# Patient Record
Sex: Male | Born: 1975 | Race: Black or African American | Hispanic: No | Marital: Married | Smoking: Current some day smoker
Health system: Southern US, Community
[De-identification: ages and names within clinical notes are randomized; demographics above are authoritative.]

## PROBLEM LIST (undated history)

## (undated) DIAGNOSIS — E785 Hyperlipidemia, unspecified: Secondary | ICD-10-CM

## (undated) DIAGNOSIS — T7840XA Allergy, unspecified, initial encounter: Secondary | ICD-10-CM

## (undated) DIAGNOSIS — G4730 Sleep apnea, unspecified: Secondary | ICD-10-CM

## (undated) DIAGNOSIS — E559 Vitamin D deficiency, unspecified: Secondary | ICD-10-CM

## (undated) HISTORY — DX: Allergy, unspecified, initial encounter: T78.40XA

## (undated) HISTORY — DX: Hyperlipidemia, unspecified: E78.5

## (undated) HISTORY — DX: Sleep apnea, unspecified: G47.30

## (undated) HISTORY — DX: Vitamin D deficiency, unspecified: E55.9

---

## 2008-07-25 ENCOUNTER — Emergency Department (HOSPITAL_COMMUNITY): Admission: EM | Admit: 2008-07-25 | Discharge: 2008-07-25 | Payer: Self-pay | Admitting: Family Medicine

## 2015-03-08 ENCOUNTER — Ambulatory Visit (INDEPENDENT_AMBULATORY_CARE_PROVIDER_SITE_OTHER): Payer: BLUE CROSS/BLUE SHIELD | Admitting: Physician Assistant

## 2015-03-08 VITALS — BP 124/70 | HR 72 | Temp 97.8°F | Resp 18 | Ht 72.0 in | Wt 219.2 lb

## 2015-03-08 DIAGNOSIS — Z13220 Encounter for screening for lipoid disorders: Secondary | ICD-10-CM

## 2015-03-08 DIAGNOSIS — Z130 Encounter for screening for diseases of the blood and blood-forming organs and certain disorders involving the immune mechanism: Secondary | ICD-10-CM

## 2015-03-08 DIAGNOSIS — R683 Clubbing of fingers: Secondary | ICD-10-CM

## 2015-03-08 DIAGNOSIS — L609 Nail disorder, unspecified: Secondary | ICD-10-CM | POA: Diagnosis not present

## 2015-03-08 DIAGNOSIS — Z131 Encounter for screening for diabetes mellitus: Secondary | ICD-10-CM | POA: Diagnosis not present

## 2015-03-08 DIAGNOSIS — Z1389 Encounter for screening for other disorder: Secondary | ICD-10-CM

## 2015-03-08 DIAGNOSIS — Z0000 Encounter for general adult medical examination without abnormal findings: Secondary | ICD-10-CM

## 2015-03-08 LAB — CBC
HEMATOCRIT: 46.2 % (ref 39.0–52.0)
Hemoglobin: 15.9 g/dL (ref 13.0–17.0)
MCH: 30.8 pg (ref 26.0–34.0)
MCHC: 34.4 g/dL (ref 30.0–36.0)
MCV: 89.5 fL (ref 78.0–100.0)
MPV: 10.3 fL (ref 8.6–12.4)
Platelets: 265 10*3/uL (ref 150–400)
RBC: 5.16 MIL/uL (ref 4.22–5.81)
RDW: 13.8 % (ref 11.5–15.5)
WBC: 11 10*3/uL — ABNORMAL HIGH (ref 4.0–10.5)

## 2015-03-08 LAB — LIPID PANEL
CHOL/HDL RATIO: 4 ratio
Cholesterol: 217 mg/dL — ABNORMAL HIGH (ref 0–200)
HDL: 54 mg/dL (ref >=40)
LDL Cholesterol: 134 mg/dL — ABNORMAL HIGH (ref 0–99)
Triglycerides: 143 mg/dL (ref <150)
VLDL: 29 mg/dL (ref 0–40)

## 2015-03-08 LAB — COMPLETE METABOLIC PANEL WITH GFR
ALBUMIN: 4.4 g/dL (ref 3.5–5.2)
ALK PHOS: 65 U/L (ref 39–117)
ALT: 29 U/L (ref 0–53)
AST: 29 U/L (ref 0–37)
BILIRUBIN TOTAL: 0.5 mg/dL (ref 0.2–1.2)
BUN: 12 mg/dL (ref 6–23)
CO2: 26 meq/L (ref 19–32)
Calcium: 9.1 mg/dL (ref 8.4–10.5)
Chloride: 104 mEq/L (ref 96–112)
Creat: 1.05 mg/dL (ref 0.50–1.35)
GFR, Est African American: >89 mL/min
GFR, Est Non African American: >89 mL/min
GLUCOSE: 87 mg/dL (ref 70–99)
POTASSIUM: 4.3 meq/L (ref 3.5–5.3)
SODIUM: 140 meq/L (ref 135–145)
Total Protein: 7.8 g/dL (ref 6.0–8.3)

## 2015-03-08 NOTE — Patient Instructions (Signed)
We have drawn labs today and I'll be in touch with you with those results.   Health Maintenance A healthy lifestyle and preventative care can promote health and wellness.  Maintain regular health, dental, and eye exams.  Eat a healthy diet. Foods like vegetables, fruits, whole grains, low-fat dairy products, and lean protein foods contain the nutrients you need and are low in calories. Decrease your intake of foods high in solid fats, added sugars, and salt. Get information about a proper diet from your health care provider, if necessary.  Regular physical exercise is one of the most important things you can do for your health. Most adults should get at least 150 minutes of moderate-intensity exercise (any activity that increases your heart rate and causes you to sweat) each week. In addition, most adults need muscle-strengthening exercises on 2 or more days a week.   Maintain a healthy weight. The body mass index (BMI) is a screening tool to identify possible weight problems. It provides an estimate of body fat based on height and weight. Your health care provider can find your BMI and can help you achieve or maintain a healthy weight. For males 20 years and older:  A BMI below 18.5 is considered underweight.  A BMI of 18.5 to 24.9 is normal.  A BMI of 25 to 29.9 is considered overweight.  A BMI of 30 and above is considered obese.  Maintain normal blood lipids and cholesterol by exercising and minimizing your intake of saturated fat. Eat a balanced diet with plenty of fruits and vegetables. Blood tests for lipids and cholesterol should begin at age 59 and be repeated every 5 years. If your lipid or cholesterol levels are high, you are over age 52, or you are at high risk for heart disease, you may need your cholesterol levels checked more frequently.Ongoing high lipid and cholesterol levels should be treated with medicines if diet and exercise are not working.  If you smoke, find out from  your health care provider how to quit. If you do not use tobacco, do not start.  Lung cancer screening is recommended for adults aged 55-80 years who are at high risk for developing lung cancer because of a history of smoking. A yearly low-dose CT scan of the lungs is recommended for people who have at least a 30-pack-year history of smoking and are current smokers or have quit within the past 15 years. A pack year of smoking is smoking an average of 1 pack of cigarettes a day for 1 year (for example, a 30-pack-year history of smoking could mean smoking 1 pack a day for 30 years or 2 packs a day for 15 years). Yearly screening should continue until the smoker has stopped smoking for at least 15 years. Yearly screening should be stopped for people who develop a health problem that would prevent them from having lung cancer treatment.  If you choose to drink alcohol, do not have more than 2 drinks per day. One drink is considered to be 12 oz (360 mL) of beer, 5 oz (150 mL) of wine, or 1.5 oz (45 mL) of liquor.  Avoid the use of street drugs. Do not share needles with anyone. Ask for help if you need support or instructions about stopping the use of drugs.  High blood pressure causes heart disease and increases the risk of stroke. Blood pressure should be checked at least every 1-2 years. Ongoing high blood pressure should be treated with medicines if weight loss and  exercise are not effective.  If you are 5245-38 years old, ask your health care provider if you should take aspirin to prevent heart disease.  Diabetes screening involves taking a blood sample to check your fasting blood sugar level. This should be done once every 3 years after age 38 if you are at a normal weight and without risk factors for diabetes. Testing should be considered at a younger age or be carried out more frequently if you are overweight and have at least 1 risk factor for diabetes.  Colorectal cancer can be detected and often  prevented. Most routine colorectal cancer screening begins at the age of 38 and continues through age 38. However, your health care provider may recommend screening at an earlier age if you have risk factors for colon cancer. On a yearly basis, your health care provider may provide home test kits to check for hidden blood in the stool. A small camera at the end of a tube may be used to directly examine the colon (sigmoidoscopy or colonoscopy) to detect the earliest forms of colorectal cancer. Talk to your health care provider about this at age 38 when routine screening begins. A direct exam of the colon should be repeated every 5-10 years through age 38, unless early forms of precancerous polyps or small growths are found.  People who are at an increased risk for hepatitis B should be screened for this virus. You are considered at high risk for hepatitis B if:  You were born in a country where hepatitis B occurs often. Talk with your health care provider about which countries are considered high risk.  Your parents were born in a high-risk country and you have not received a shot to protect against hepatitis B (hepatitis B vaccine).  You have HIV or AIDS.  You use needles to inject street drugs.  You live with, or have sex with, someone who has hepatitis B.  You are a man who has sex with other men (MSM).  You get hemodialysis treatment.  You take certain medicines for conditions like cancer, organ transplantation, and autoimmune conditions.  Hepatitis C blood testing is recommended for all people born from [id number] through 1965 and any individual with known risk factors for hepatitis C.  Healthy men should no longer receive prostate-specific antigen (PSA) blood tests as part of routine cancer screening. Talk to your health care provider about prostate cancer screening.  Testicular cancer screening is not recommended for adolescents or adult males who have no symptoms. Screening includes  self-exam, a health care provider exam, and other screening tests. Consult with your health care provider about any symptoms you have or any concerns you have about testicular cancer.  Practice safe sex. Use condoms and avoid high-risk sexual practices to reduce the spread of sexually transmitted infections (STIs).  You should be screened for STIs, including gonorrhea and chlamydia if:  You are sexually active and are younger than 24 years.  You are older than 24 years, and your health care provider tells you that you are at risk for this type of infection.  Your sexual activity has changed since you were last screened, and you are at an increased risk for chlamydia or gonorrhea. Ask your health care provider if you are at risk.  If you are at risk of being infected with HIV, it is recommended that you take a prescription medicine daily to prevent HIV infection. This is called pre-exposure prophylaxis (PrEP). You are considered at risk if:  You  are a man who has sex with other men (MSM).  You are a heterosexual man who is sexually active with multiple partners.  You take drugs by injection.  You are sexually active with a partner who has HIV.  Talk with your health care provider about whether you are at high risk of being infected with HIV. If you choose to begin PrEP, you should first be tested for HIV. You should then be tested every 3 months for as long as you are taking PrEP.  Use sunscreen. Apply sunscreen liberally and repeatedly throughout the day. You should seek shade when your shadow is shorter than you. Protect yourself by wearing long sleeves, pants, a wide-brimmed hat, and sunglasses year round whenever you are outdoors.  Tell your health care provider of new moles or changes in moles, especially if there is a change in shape or color. Also, tell your health care provider if a mole is larger than the size of a pencil eraser.  A one-time screening for abdominal aortic aneurysm  (AAA) and surgical repair of large AAAs by ultrasound is recommended for men aged 14-75 years who are current or former smokers.  Stay current with your vaccines (immunizations). Document Released: 03/20/2008 Document Revised: 09/27/2013 Document Reviewed: 02/17/2011 [facility] Patient Information 2015 [place], [place]. This information is not intended to replace advice given to you by your health care provider. Make sure you discuss any questions you have with your health care provider.

## 2015-03-08 NOTE — Progress Notes (Signed)
Subjective:    Patient ID: Paul Mayer, male    DOB: March 07, 1976, 39 y.o.   MRN: [id number]  Chief Complaint  Patient presents with  . Annual Exam   There are no active problems to display for this patient.  Prior to Admission medications   Not on File   Medications, allergies, past medical history, surgical history, family history, social history and problem list reviewed and updated.   HPI  [age] yom presents for cpe.  He is healthy and has no concerns or issues. States his wife wanted him to come get a cpe. He denies any issues or complaints.   Vision: None, does not wear glasses/contacts Dentist: Once yearly Exercise: 1-2 x week, free weights and walking. Diet: Nothing specific. Avoids red meat as feels like he gets upset stomach with it.  He works full time as an Art gallery managerengineer.   He received tdap vaccine 4 yrs ago through work.   Review of Systems Per CPE ROS sheet. 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  Seasonal allergies: Takes claritin prn.     Objective:   Physical Exam  Constitutional: He is oriented to person, place, and time. He appears well-developed and well-nourished.  Non-toxic appearance. He does not have a sickly appearance. He does not appear ill. No distress.  BP 124/70 mmHg  Pulse 72  Temp(Src) 97.8 F (36.6 C) (Oral)  Resp 18  Ht 6' (1.829 m)  Wt 219 lb 3.2 oz (99.428 kg)  BMI 29.72 kg/m2  SpO2 98%   HENT:  Right Ear: Tympanic membrane normal.  Left Ear: Tympanic membrane normal.  Nose: Nose normal. Right sinus exhibits no maxillary sinus tenderness and no frontal sinus tenderness. Left sinus exhibits no maxillary sinus tenderness and no frontal sinus tenderness.  Mouth/Throat: Uvula is midline, oropharynx is clear and moist and mucous membranes are normal.  Eyes: Conjunctivae and EOM are normal. Pupils are equal, round, and reactive to light.  Neck: Normal range of motion. Carotid bruit is not present. No  thyroid mass and no thyromegaly present.  Cardiovascular: Normal rate, regular rhythm and normal heart sounds.   Pulses:      Dorsalis pedis pulses are 2+ on the right side, and 2+ on the left side.       Posterior tibial pulses are 2+ on the right side, and 2+ on the left side.  Pulmonary/Chest: Effort normal and breath sounds normal.  Abdominal: Soft. Normal appearance and bowel sounds are normal. There is no hepatosplenomegaly. There is no tenderness. There is no CVA tenderness.  Lymphadenopathy:       Head (right side): No submental, no submandibular and no tonsillar adenopathy present.       Head (left side): No submental, no submandibular and no tonsillar adenopathy present.    He has no cervical adenopathy.  Neurological: He is alert and oriented to person, place, and time. He has normal strength. No cranial nerve deficit or sensory deficit.  Skin: Nails show clubbing.  Psychiatric: He has a normal mood and affect. His speech is normal and behavior is normal.      Assessment & Plan:   39 yom presents for cpe.  Annual physical exam Screening for deficiency anemia - Plan: CBC Screening for nephropathy - Plan: COMPLETE METABOLIC PANEL WITH GFR Screening for hyperlipidemia - Plan: Lipid panel Screening for diabetes mellitus - Plan: COMPLETE METABOLIC PANEL WITH GFR --normal exam, vitals other than clubbing below --screening tests as above,  pt declines std testing --not due for immunizations or preventative testing at this time  Clubbing of nail  --normal cardiac, pulm exam today --pt denies cardiac or pulm sx --no further w/u at this time  Donnajean Lopes, PA-C Physician Assistant-Certified [facility]  03/08/2015 2:44 PM

## 2016-11-09 ENCOUNTER — Ambulatory Visit (HOSPITAL_COMMUNITY)
Admission: EM | Admit: 2016-11-09 | Discharge: 2016-11-09 | Disposition: A | Discharge status: Home / Self Care | Payer: Self-pay | Attending: Family Medicine | Admitting: Family Medicine

## 2016-11-09 ENCOUNTER — Encounter (HOSPITAL_COMMUNITY): Payer: Self-pay | Admitting: Emergency Medicine

## 2016-11-09 ENCOUNTER — Ambulatory Visit (INDEPENDENT_AMBULATORY_CARE_PROVIDER_SITE_OTHER): Payer: Self-pay

## 2016-11-09 DIAGNOSIS — S96911A Strain of unspecified muscle and tendon at ankle and foot level, right foot, initial encounter: Secondary | ICD-10-CM

## 2016-11-09 DIAGNOSIS — S66211A Strain of extensor muscle, fascia and tendon of right thumb at wrist and hand level, initial encounter: Secondary | ICD-10-CM

## 2016-11-09 DIAGNOSIS — M542 Cervicalgia: Secondary | ICD-10-CM

## 2016-11-09 DIAGNOSIS — S39012A Strain of muscle, fascia and tendon of lower back, initial encounter: Secondary | ICD-10-CM

## 2016-11-09 MED ORDER — DICLOFENAC SODIUM 75 MG PO TBEC: 75.0000 mg | DELAYED_RELEASE_TABLET | Freq: Two times a day (BID) | ORAL | 0 refills | Status: DC

## 2016-11-09 NOTE — Discharge Instructions (Signed)
You have an feel sore in multiple areas over the next several days. You may take ibuprofen to help reduce some of these sore ligaments and muscles.  We don't see any bony abnormality in the neck, so I expect this to be resolving over the next 7 days as well. Please feel free to come back in a week if her symptoms have not improved.
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
## 2016-11-12 ENCOUNTER — Emergency Department (HOSPITAL_COMMUNITY): Payer: Managed Care, Other (non HMO)

## 2016-11-12 ENCOUNTER — Emergency Department (HOSPITAL_COMMUNITY)
Admission: EM | Admit: 2016-11-12 | Discharge: 2016-11-12 | Disposition: A | Discharge status: Home / Self Care | Payer: Managed Care, Other (non HMO) | Attending: Emergency Medicine | Admitting: Emergency Medicine

## 2016-11-12 ENCOUNTER — Encounter (HOSPITAL_COMMUNITY): Payer: Self-pay | Admitting: Emergency Medicine

## 2016-11-12 DIAGNOSIS — Y999 Unspecified external cause status: Secondary | ICD-10-CM | POA: Diagnosis not present

## 2016-11-12 DIAGNOSIS — F17290 Nicotine dependence, other tobacco product, uncomplicated: Secondary | ICD-10-CM | POA: Insufficient documentation

## 2016-11-12 DIAGNOSIS — H538 Other visual disturbances: Secondary | ICD-10-CM | POA: Diagnosis not present

## 2016-11-12 DIAGNOSIS — Y92410 Unspecified street and highway as the place of occurrence of the external cause: Secondary | ICD-10-CM | POA: Insufficient documentation

## 2016-11-12 DIAGNOSIS — Z79899 Other long term (current) drug therapy: Secondary | ICD-10-CM | POA: Insufficient documentation

## 2016-11-12 DIAGNOSIS — R110 Nausea: Secondary | ICD-10-CM

## 2016-11-12 DIAGNOSIS — Y939 Activity, unspecified: Secondary | ICD-10-CM | POA: Diagnosis not present

## 2016-11-12 DIAGNOSIS — G44321 Chronic post-traumatic headache, intractable: Secondary | ICD-10-CM | POA: Insufficient documentation

## 2016-11-12 DIAGNOSIS — R51 Headache: Secondary | ICD-10-CM | POA: Diagnosis present

## 2016-11-12 LAB — I-STAT CREATININE, ED: Creatinine, Ser: 1.1 mg/dL (ref 0.61–1.24)

## 2016-11-12 MED ORDER — ACETAMINOPHEN 500 MG PO TABS
1000.0000 mg | ORAL_TABLET | Freq: Once | ORAL | Status: AC
  Administered 2016-11-12: 1000 mg via ORAL
  Filled 2016-11-12: qty 2

## 2016-11-12 MED ORDER — SODIUM CHLORIDE 0.9 % IJ SOLN
INTRAMUSCULAR | Status: AC
  Filled 2016-11-12: qty 50

## 2016-11-12 MED ORDER — IOPAMIDOL (ISOVUE-370) INJECTION 76%
INTRAVENOUS | Status: AC
  Administered 2016-11-12: 100 mL
  Filled 2016-11-12: qty 100

## 2016-11-12 MED ORDER — ONDANSETRON HCL 4 MG PO TABS: 4.0000 mg | ORAL_TABLET | Freq: Four times a day (QID) | ORAL | 0 refills | Status: DC

## 2016-11-12 MED ORDER — METHOCARBAMOL 500 MG PO TABS: 500.0000 mg | ORAL_TABLET | Freq: Two times a day (BID) | ORAL | 0 refills | Status: DC
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
## 2016-11-12 NOTE — ED Notes (Signed)
Patient was alert, oriented and stable upon discharge. RN went over AVS and patient had no further questions.  Pt was advised not to drive on muscle relaxer.

## 2017-05-27 ENCOUNTER — Ambulatory Visit (INDEPENDENT_AMBULATORY_CARE_PROVIDER_SITE_OTHER): Payer: Managed Care, Other (non HMO) | Admitting: Emergency Medicine

## 2017-05-27 ENCOUNTER — Encounter: Payer: Self-pay | Admitting: Emergency Medicine

## 2017-05-27 VITALS — BP 120/76 | HR 70 | Temp 98.7°F | Resp 16 | Ht 71.75 in | Wt 236.0 lb

## 2017-05-27 DIAGNOSIS — R5383 Other fatigue: Secondary | ICD-10-CM | POA: Diagnosis not present

## 2017-05-27 DIAGNOSIS — R0683 Snoring: Secondary | ICD-10-CM | POA: Diagnosis not present

## 2017-05-27 DIAGNOSIS — R400 Somnolence: Secondary | ICD-10-CM

## 2017-05-27 NOTE — Progress Notes (Signed)
Paul Mayer 41 y.o.   Chief Complaint  Patient presents with  . Referral    snoring and tired in am    HISTORY OF PRESENT ILLNESS: This is a 41 y.o. male complaining of feeling tired in am with daytime sleepiness; wife states he has loud snoring with periods of interrupted breathing.  HPI   Prior to Admission medications   Medication Sig Start Date End Date Taking? Authorizing Provider  diclofenac (VOLTAREN) 75 MG EC tablet Take 1 tablet (75 mg total) by mouth 2 (two) times daily. 11/09/16   Elvina Sidle, MD  methocarbamol (ROBAXIN) 500 MG tablet Take 1 tablet (500 mg total) by mouth 2 (two) times daily. 11/12/16   Liberty Handy, PA-C  ondansetron (ZOFRAN) 4 MG tablet Take 1 tablet (4 mg total) by mouth every 6 (six) hours. 11/12/16   Liberty Handy, PA-C    No Known Allergies  There are no active problems to display for this patient.   Past Medical History:  Diagnosis Date  . Allergy     No past surgical history on file.  Social History   Social History  . Marital status: Married    Spouse name: N/A  . Number of children: N/A  . Years of education: N/A   Occupational History  . Not on file.   Social History Main Topics  . Smoking status: Current Some Day Smoker    Packs/day: 0.00    Types: Cigars  . Smokeless tobacco: Never Used  . Alcohol use Yes  . Drug use: No  . Sexual activity: Not on file   Other Topics Concern  . Not on file   Social History Narrative   ** Merged History Encounter **        Family History  Problem Relation Age of Onset  . Hyperlipidemia Father      Review of Systems  Constitutional: Negative.  Negative for chills and fever.  HENT: Negative.   Eyes: Negative.   Respiratory: Negative for cough, shortness of breath and wheezing.   Cardiovascular: Negative for chest pain, palpitations and leg swelling.  Gastrointestinal: Negative for abdominal pain, diarrhea, nausea and vomiting.  Musculoskeletal: Negative  for myalgias and neck pain.  Skin: Negative for rash.  Neurological: Negative for dizziness, tingling, sensory change, focal weakness and headaches.  Endo/Heme/Allergies: Negative.   All other systems reviewed and are negative.  Vitals:   05/27/17 1737  BP: 120/76  Pulse: 70  Resp: 16  Temp: 98.7 F (37.1 C)  SpO2: 99%     Physical Exam  Constitutional: He is oriented to person, place, and time. He appears well-developed and well-nourished.  HENT:  Head: Normocephalic and atraumatic.  Mouth/Throat: Oropharynx is clear and moist.  Eyes: Pupils are equal, round, and reactive to light. Conjunctivae and EOM are normal.  Neck: Normal range of motion. Neck supple.  Cardiovascular: Normal rate, regular rhythm and normal heart sounds.   Pulmonary/Chest: Effort normal and breath sounds normal.  Musculoskeletal: Normal range of motion.  Neurological: He is alert and oriented to person, place, and time. No sensory deficit. He exhibits normal muscle tone.  Skin: Skin is warm and dry. Capillary refill takes less than 2 seconds. No rash noted.  Psychiatric: He has a normal mood and affect. His behavior is normal.  Vitals reviewed.    ASSESSMENT & PLAN: [name] was seen today for referral.  Diagnoses and all orders for this visit:  Snoring Comments: suspected sleep apnea Orders: -  Ambulatory referral to Neurology  Daytime sleepiness -     Ambulatory referral to Neurology  Tiredness -     Ambulatory referral to Neurology    Patient Instructions       IF you received an x-ray today, you will receive an invoice from [facility]. Please contact [facility] at [phone] with questions or concerns regarding your invoice.   IF you received labwork today, you will receive an invoice from [facility]. Please contact [facility] at [phone] with questions or concerns regarding your invoice.   Our billing staff will not be able to assist you with questions  regarding bills from these companies.  You will be contacted with the lab results as soon as they are available. The fastest way to get your results is to activate your My Chart account. Instructions are located on the last page of this paperwork. If you have not heard from [place] regarding the results in 2 weeks, please contact this office.     Sleep Apnea Sleep apnea is a condition that affects breathing. People with sleep apnea have moments during sleep when their breathing pauses briefly or gets shallow. Sleep apnea can cause these symptoms:  Trouble staying asleep.  Sleepiness or tiredness during the day.  Irritability.  Loud snoring.  Morning headaches.  Trouble concentrating.  Forgetting things.  Less interest in sex.  Being sleepy for no reason.  Mood swings.  Personality changes.  Depression.  Waking up a lot during the night to pee (urinate).  Dry mouth.  Sore throat.  Follow these instructions at home:  Make any changes in your routine that your doctor recommends.  Eat a healthy, well-balanced diet.  Take over-the-counter and prescription medicines only as told by your doctor.  Avoid using alcohol, calming medicines (sedatives), and narcotic medicines.  Take steps to lose weight if you are overweight.  If you were given a machine (device) to use while you sleep, use it only as told by your doctor.  Do not use any tobacco products, such as cigarettes, chewing tobacco, and e-cigarettes. If you need help quitting, ask your doctor.  Keep all follow-up visits as told by your doctor. This is important. Contact a doctor if:  The machine that you were given to use during sleep is uncomfortable or does not seem to be working.  Your symptoms do not get better.  Your symptoms get worse. Get help right away if:  Your chest hurts.  You have trouble breathing in enough air (shortness of breath).  You have an uncomfortable feeling in your back, arms, or  stomach.  You have trouble talking.  One side of your body feels weak.  A part of your face is hanging down (drooping). These symptoms may be an emergency. Do not wait to see if the symptoms will go away. Get medical help right away. Call your local emergency services (911 in the [place] Do not drive yourself to the hospital. This information is not intended to replace advice given to you by your health care provider. Make sure you discuss any questions you have with your health care provider. Document Released: 07/01/2008 Document Revised: 05/18/2016 Document Reviewed: 07/02/2015 Elsevier Interactive Patient Education  2018 Elsevier Inc.      Edwina Barth, MD [facility]

## 2017-05-27 NOTE — Patient Instructions (Addendum)
     IF you received an x-ray today, you will receive an invoice from [facility]. Please contact [facility] at [phone] with questions or concerns regarding your invoice.   IF you received labwork today, you will receive an invoice from [facility]. Please contact [facility] at [phone] with questions or concerns regarding your invoice.   Our billing staff will not be able to assist you with questions regarding bills from these companies.  You will be contacted with the lab results as soon as they are available. The fastest way to get your results is to activate your My Chart account. Instructions are located on the last page of this paperwork. If you have not heard from us regarding the results in 2 weeks, please contact this office.     Sleep Apnea Sleep apnea is a condition that affects breathing. People with sleep apnea have moments during sleep when their breathing pauses briefly or gets shallow. Sleep apnea can cause these symptoms:  Trouble staying asleep.  Sleepiness or tiredness during the day.  Irritability.  Loud snoring.  Morning headaches.  Trouble concentrating.  Forgetting things.  Less interest in sex.  Being sleepy for no reason.  Mood swings.  Personality changes.  Depression.  Waking up a lot during the night to pee (urinate).  Dry mouth.  Sore throat.  Follow these instructions at home:  Make any changes in your routine that your doctor recommends.  Eat a healthy, well-balanced diet.  Take over-the-counter and prescription medicines only as told by your doctor.  Avoid using alcohol, calming medicines (sedatives), and narcotic medicines.  Take steps to lose weight if you are overweight.  If you were given a machine (device) to use while you sleep, use it only as told by your doctor.  Do not use any tobacco products, such as cigarettes, chewing tobacco, and e-cigarettes. If you need help quitting, ask your  doctor.  Keep all follow-up visits as told by your doctor. This is important. Contact a doctor if:  The machine that you were given to use during sleep is uncomfortable or does not seem to be working.  Your symptoms do not get better.  Your symptoms get worse. Get help right away if:  Your chest hurts.  You have trouble breathing in enough air (shortness of breath).  You have an uncomfortable feeling in your back, arms, or stomach.  You have trouble talking.  One side of your body feels weak.  A part of your face is hanging down (drooping). These symptoms may be an emergency. Do not wait to see if the symptoms will go away. Get medical help right away. Call your local emergency services (911 in the [place] Do not drive yourself to the hospital. This information is not intended to replace advice given to you by your health care provider. Make sure you discuss any questions you have with your health care provider. Document Released: 07/01/2008 Document Revised: 05/18/2016 Document Reviewed: 07/02/2015 Elsevier Interactive Patient Education  2018 [employer] Inc.  

## 2017-06-11 ENCOUNTER — Ambulatory Visit (INDEPENDENT_AMBULATORY_CARE_PROVIDER_SITE_OTHER): Payer: Managed Care, Other (non HMO) | Admitting: Neurology

## 2017-06-11 ENCOUNTER — Encounter: Payer: Self-pay | Admitting: Neurology

## 2017-06-11 VITALS — BP 120/81 | HR 62 | Ht 73.0 in | Wt 234.0 lb

## 2017-06-11 DIAGNOSIS — G4730 Sleep apnea, unspecified: Secondary | ICD-10-CM

## 2017-06-11 DIAGNOSIS — G4710 Hypersomnia, unspecified: Secondary | ICD-10-CM

## 2017-06-11 DIAGNOSIS — R0683 Snoring: Secondary | ICD-10-CM

## 2017-06-11 NOTE — Progress Notes (Signed)
SLEEP MEDICINE CLINIC   Provider:  Melvyn Novas, M D  Primary Care Physician:   Referring Provider: Georgina Quint, *   Chief Complaint  Patient presents with  . New Patient (Initial Visit)    pt ia alone rrom 11, complains of feeling tired in the am and having daytime sleepiness. pt wife has told him he snores and has some periods of uninterupted breathing during sleep. avg about 6 hours of uninterupted sleep.     HPI:  Paul Mayer is a 41 y.o. male , seen here as in a referral/ revisit  from Dr. Alvy Bimler for hypersomnia.   Chief complaint according to patient :" I can go to sleep easily, and feel not refreshed in AM "    I am seeing Paul Mayer today for a sleep consultation on 06/11/2017. He is a 41 year old right-handed African-American gentleman referred by Dr. Alvy Bimler. The patient had reported daytime fatigue, feeling tired and his wife had stated that she snores loudly-  she also witnessed periods of interrupted breathing, likely apnea.   Paul Mayer had no significant past medical history, does not take any prescription medications at this time, and both parents are alive and well.  Sleep habits are as follows: Paul Mayer would like a bedtime between 9 and 10 PM, but it has been difficult to adhere to this pattern due to his 61-year-old sons variable bedtimes. Many mornings he is still awake at one or 2 AM. Once asleep he stays asleep, he does not have frequent bathroom breaks, he has not woken himself short of breath or choking- but snoring. The patient sleeps on average 6 hours at night, he sleeps on his side, on one pillow. The [facility] bedroom is cool, quiet and dark and conducive to sleep. He rises with difficulties in the morning, often hitting the snooze button. His goal would be to rise at 6:30 but it's usually around 7. Work starts at 7:30 AM, he basically tele-commutes from home.Paul Mayer occasionally has vivid, lucid dreams- but not on a nightly basis. He does  suffer from some seasonal allergies, and during allergy season may wake this morning headaches, but not on a regular basis. However he feels not ready to rise in the morning he's not energized or refreshed. He states that after 5 PM if he can retreat to a reclining position, he will sleep. These may take 2 hours. He has no trouble going back to sleep at night, he does feel refreshed by naps.   Sleep medical history and family sleep history:  Father is snoring. Obese. Mothers snores. Nobody with a diagnosed OSA. no history of sleep walking.  Social history: married, toddle son, sales in skincare/ wound care. smoking cigars - 8 a month( week end smoker).ETOH : whisky, red wine - 2 glasses at night.  Caffeine : 1 mug in AM , no sodas, rare iced teas.    Review of Systems: Out of a complete 14 system review, the patient complains of only the following symptoms, and all other reviewed systems are negative.  Snoring. weight gain. Alcohol use.  Epworth score 8 , Fatigue severity score 28  , depression score n/a    Social History   Social History  . Marital status: Married    Spouse name: N/A  . Number of children: N/A  . Years of education: N/A   Occupational History  . Not on file.   Social History Main Topics  . Smoking status: Current Some Day Smoker  Packs/day: 0.00    Types: Cigars  . Smokeless tobacco: Never Used  . Alcohol use Yes  . Drug use: No  . Sexual activity: Not on file   Other Topics Concern  . Not on file   Social History Narrative   ** Merged History Encounter **        Family History  Problem Relation Age of Onset  . Hyperlipidemia Father     Past Medical History:  Diagnosis Date  . Allergy     No past surgical history on file.  No current outpatient prescriptions on file.   No current facility-administered medications for this visit.     Allergies as of 06/11/2017  . (No Known Allergies)    Vitals: BP 120/81   Pulse 62   Ht   (1.854 m)   Wt 234 lb (106.1 kg)   BMI 30.87 kg/m  Last Weight:  Wt Readings from Last 1 Encounters:  06/11/17 234 lb (106.1 kg)   ZOX:WRUE mass index is 30.87 kg/m.     Last Height:   Ht Readings from Last 1 Encounters:  06/11/17  (1.854 m)    Physical exam:  General: The patient is awake, alert and appears not in acute distress. The patient is well groomed. Head: Normocephalic, atraumatic. Neck is supple. Mallampati 4,  neck circumference: 18. Nasal airflow patent. Retrognathia is seen.  Cardiovascular:  Regular rate and rhythm , without  murmurs or carotid bruit, and without distended neck veins. Respiratory: Lungs are clear to auscultation. Skin:  Without evidence of edema, or rash Trunk: BMI is elevated . The patient's posture is erect   Neurologic exam : The patient is awake and alert, oriented to place and time.   Memory subjective described as intact.  Attention span & concentration ability appears normal.  Speech is fluent,  without dysarthria, dysphonia or aphasia.  Mood and affect are appropriate.  Cranial nerves: Pupils are equal and briskly reactive to light. Funduscopic exam without  evidence of pallor or edema. Extraocular movements  in vertical and horizontal planes intact and without nystagmus. Visual fields by finger perimetry are intact. Hearing to finger rub intact. Facial sensation intact to fine touch. Facial motor strength is symmetric and tongue and uvula move midline. Shoulder shrug was symmetrical.   Motor exam:  Normal tone, muscle bulk and symmetric strength in all extremities. Sensory:  Fine touch, pinprick and vibration were tested in all extremities. Proprioception tested in the upper extremities was normal. Coordination: Rapid alternating movements/ Finger-to-nose maneuver  normal without evidence of ataxia, dysmetria or tremor. Gait and station: Patient walks without assistive device. Turns with 3 Steps. Romberg testing is negative. Deep  tendon reflexes: in the  upper and lower extremities are symmetric and intact. Babinski maneuver response is downgoing.   Assessment:  After physical and neurologic examination, review of laboratory studies,  Personal review of imaging studies, reports of other /same  Imaging studies, results of polysomnography and / or neurophysiology testing and pre-existing records as far as provided in visit., my assessment is   1) Mr. Brickey may have apnea , he is snoring and excessively daytime sleepy. He is somewhat sleep deprived. His sleep rhythm has suffered under his son's sleep times- no established bedtime and no routines and rituals.  The patient was advised of the nature of the diagnosed disorder , the treatment options and the  risks for general health and wellness arising from not treating the condition.   I spent more  than 45 minutes of face to face time with the patient.  Greater than 50% of time was spent in counseling and coordination of care. We have discussed the diagnosis and differential and I answered the patient's questions.    Plan:  Treatment plan and additional workup : establish a bedtime for you and your child.  We will perform a HST to see if apnea is present. If you are only snoring- we can use a dental device.       Melvyn NovasARMEN Yitzchak Kothari, MD 06/11/2017, 1:54 PM  Certified in Neurology by ABPN Certified in Sleep Medicine by [facility]  [facility] [address], [address] [place], [place] [id number]

## 2017-06-11 NOTE — Patient Instructions (Signed)
Please remember to try to maintain good sleep hygiene, which means: Keep a regular sleep and wake schedule, try not to exercise or have a meal within 2 hours of your bedtime, try to keep your bedroom conducive for sleep, that is, cool and dark, without light distractors such as an illuminated alarm clock, and refrain from watching TV right before sleep or in the middle of the night and do not keep the TV or radio on during the night. Also, try not to use or play on electronic devices at bedtime, such as your cell phone, tablet PC or laptop. If you like to read at bedtime on an electronic device, try to dim the background light as much as possible. Do not eat in the middle of the night.   Sleep Studies A sleep study (polysomnogram) is a series of tests done while you are sleeping. It can show how well you sleep. This can help your health care provider diagnose a sleep disorder and show how severe your sleep disorder is. A sleep study may lead to treatment that will help you sleep better and prevent other medical problems caused by poor sleep. If you have a sleep disorder, you may also be at risk for:  Sleep-related accidents.  High blood pressure.  Heart disease.  Stroke.  Other medical conditions.  Sleep disorders are common. Your health care provider may suspect a sleep disorder if you:  Have loud snoring most nights.  Have brief periods when you stop breathing at night.  Feel sleepy on most days.  Fall asleep suddenly during the day.  Have trouble falling asleep or staying asleep.  Feel like you need to move your legs when trying to fall asleep.  Have dreams that seem very real shortly after falling asleep.  Feel like you cannot move when you first wake up.  Which tests will I need to have? Most sleep studies last all night and include these tests:  Recordings of your brain activity.  Recordings of your eye movements.  Recording of your heart rate and rhythm.  Blood  pressure readings.  Readings of the amount of oxygen in your blood.  Measurements of your chest and belly movement as you breathe during sleep.  If you have signs of the sleep disorder called sleep apnea during your test, you may get a mask to wear for the second half of the night.  The mask provides continuous positive airway pressure (CPAP). This may improve sleep apnea significantly.  You will then have all tests done again with the mask in place to see if your measurements and recordings change.  How are sleep studies done? Most sleep studies are done over one full night of sleep.  You will arrive at the study center in the evening and can go home in the morning.  Bring your pajamas and toothbrush.  Do not have caffeine on the day of your sleep study.  Your health care provider will let you know if you need to stop taking any of your regular medicines before the test.  To do the tests included in a polysomnogram, you will have:  Round, sticky patches with sensors attached to recording wires (electrodes) placed on your scalp, face, chest, and limbs.  Wires from all the electrodes and sensors run from your bed to a computer. The wires can be taken off and put back on if you need to get out of bed to go to the bathroom.  A sensor placed over your nose  to measure airflow.  A finger clip put on one finger to measure your blood oxygen level.  A belt around your belly and a belt around your chest to measure breathing movements.  Where are sleep studies done? Sleep studies are done at sleep centers. A sleep center may be inside a hospital, office, or clinic. The room where you have the study may look like a hospital room or a hotel room. The health care providers doing the study may come in and out of the room during the study. Most of the time, they will be in another room monitoring your test. How is information from sleep studies helpful? A polysomnogram can be used along with  your medical history and a physical exam to diagnose conditions, such as:  Sleep apnea.  Restless legs syndrome.  Sleep-related seizure disorders.  Sleep-related movement disorders.  A medical doctor who specializes in sleep will evaluate your sleep study. The specialist will share the results with your primary health care provider. Treatments based on your sleep study may include:  Improving your sleep habits (sleep hygiene).  Wearing a CPAP mask.  Wearing an oral device at night to improve breathing and reduce snoring.  Taking medicine for: ? Restless legs syndrome. ? Sleep-related seizure disorder. ? Sleep-related movement disorder.  This information is not intended to replace advice given to you by your health care provider. Make sure you discuss any questions you have with your health care provider. Document Released: 03/29/2003 Document Revised: 05/18/2016 Document Reviewed: 11/28/2013 Elsevier Interactive Patient Education  [employer]2018 Elsevier Inc.

## 2017-06-30 ENCOUNTER — Institutional Professional Consult (permissible substitution): Payer: Managed Care, Other (non HMO) | Admitting: Neurology

## 2017-07-07 ENCOUNTER — Telehealth: Payer: Self-pay | Admitting: *Deleted

## 2017-07-07 NOTE — Telephone Encounter (Signed)
Faxed signed forms [facility].

## 2017-07-08 ENCOUNTER — Telehealth: Payer: Self-pay | Admitting: *Deleted

## 2017-07-08 NOTE — Telephone Encounter (Signed)
Faxed signed order and confirmation page received at 8:24 am.

## 2017-07-15 ENCOUNTER — Ambulatory Visit (INDEPENDENT_AMBULATORY_CARE_PROVIDER_SITE_OTHER): Payer: Managed Care, Other (non HMO) | Admitting: Neurology

## 2017-07-15 DIAGNOSIS — G4730 Sleep apnea, unspecified: Secondary | ICD-10-CM

## 2017-07-15 DIAGNOSIS — G4710 Hypersomnia, unspecified: Secondary | ICD-10-CM | POA: Diagnosis not present

## 2017-07-15 DIAGNOSIS — R0683 Snoring: Secondary | ICD-10-CM

## 2017-07-22 ENCOUNTER — Telehealth: Payer: Self-pay | Admitting: Neurology

## 2017-07-22 NOTE — Procedures (Signed)
NAME:   Paul ArenaLeonard Mayer                                                              DOB: 1975/12/30 MEDICAL RECORD No: [id number]                                                 DOS: 07/15/17 REFERRING PHYSICIAN:  STUDY PERFORMED: Home Sleep Study  HISTORY:  Chief complaint according to patient:" I can go to sleep easily, sleep hours and feel not refreshed in AM"  [name] was seen for a sleep consultation on 06/11/2017. He is a 41 year old right-handed African-American gentleman referred by Dr. Alvy BimlerSagardia. The patient reported daytime fatigue, and his wife had stated that she snores loudly and also witnessed periods of interrupted breathing, likely apnea. BMI 31. The Epworth Sleepiness score was endorsed at 8/24 points, Fatigue severity score at 28, depression score N/A.  STUDY RESULTS: Total Recording:  7 hours, 48 minutes Total Apnea/Hypopnea Index (AHI):  9.4/hour Average Oxygen Saturation SpO2: 94 %; Lowest Oxygen Saturation:  82 %  Time Oxygen Saturation Below 88%:   3 minutes= 1 % Average Heart Rate: 74 bpm, between 64 and 115 bpm.   IMPRESSION: Mild Obstructive Apnea, not associated with hypoxemia or tachy-brady cardia.   RECOMMENDATION:  options include weight loss, snoring therapy by dental device, and CPAP treatment.  If snoring is related to supine position a tennis ball attached to the back of a pajama top can help to stay off the supine position (the back). The most conservative approach of weight loss is likely the most effective I certify that I have reviewed the raw data recording prior to the issuance of this report in accordance with the standards of the American Academy of Sleep Medicine (AASM). Melvyn Novasarmen Bertel Venard, MD       07-22-2017  [facility] Medical Director Diplomat, ABPN and ABSM Member of and accredited by AASM

## 2017-07-22 NOTE — Telephone Encounter (Signed)
Called patient to discuss sleep study results. No answer at this time. Unable to leave VM at this time due to mailbox full. Will attempt again

## 2017-07-22 NOTE — Telephone Encounter (Signed)
-----   Message from Melvyn Novasarmen Dohmeier, MD sent at 07/22/2017  1:02 PM EDT ----- Mr. Paul Mayer has mild apnea, and did not endorse major daytime sleepiness. Given that the apnea is mild and no physiological stress indicators were present, I recommend to pursue weight loss as main treatment . If [name] feels that the apnea she witnessed  is not reflected in this mild  result , we can repeat the sleep study. CD  Cc Dr Alvy BimlerSagardia

## 2017-07-28 NOTE — Telephone Encounter (Signed)
Pt called he said VM is empty, please call

## 2017-07-28 NOTE — Telephone Encounter (Signed)
Called and spoke with the patient in regards to sleep study results. Informed him of all that was said and recommended by Dr Vickey Hugerohmeier. Pt states he would like to proceed forward with a dental device to help with snoring because he snores whether he is on his side or back. I will place the order for him. Pt verbalized understanding and had no further questions.

## 2017-08-10 ENCOUNTER — Other Ambulatory Visit: Payer: Self-pay | Admitting: Neurology

## 2017-08-10 DIAGNOSIS — G4733 Obstructive sleep apnea (adult) (pediatric): Secondary | ICD-10-CM

## 2017-08-10 DIAGNOSIS — R0683 Snoring: Secondary | ICD-10-CM

## 2017-08-10 NOTE — Telephone Encounter (Signed)
Spoke with the patient and made him aware that the dental referral was in. I informed him that someone from our referrals dept will contact him about getting that apt set up.

## 2017-08-10 NOTE — Telephone Encounter (Signed)
Pt called in regards to referral for dental device. Please call him at [phone]
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
## 2020-07-13 ENCOUNTER — Ambulatory Visit (HOSPITAL_COMMUNITY)
Admission: RE | Admit: 2020-07-13 | Discharge: 2020-07-13 | Disposition: A | Discharge status: Home / Self Care | Payer: Managed Care, Other (non HMO) | Source: Ambulatory Visit | Attending: Pulmonary Disease | Admitting: Pulmonary Disease

## 2020-07-13 ENCOUNTER — Other Ambulatory Visit: Payer: Self-pay | Admitting: Family

## 2020-07-13 DIAGNOSIS — U071 COVID-19: Secondary | ICD-10-CM | POA: Diagnosis not present

## 2020-07-13 DIAGNOSIS — E663 Overweight: Secondary | ICD-10-CM

## 2020-07-13 MED ORDER — DIPHENHYDRAMINE HCL 50 MG/ML IJ SOLN: 50.0000 mg | Freq: Once | INTRAMUSCULAR | Status: DC | PRN

## 2020-07-13 MED ORDER — SODIUM CHLORIDE 0.9 % IV SOLN: Freq: Once | INTRAVENOUS | Status: AC

## 2020-07-13 MED ORDER — METHYLPREDNISOLONE SODIUM SUCC 125 MG IJ SOLR: 125.0000 mg | Freq: Once | INTRAMUSCULAR | Status: DC | PRN

## 2020-07-13 MED ORDER — EPINEPHRINE 0.3 MG/0.3ML IJ SOAJ: 0.3000 mg | Freq: Once | INTRAMUSCULAR | Status: DC | PRN

## 2020-07-13 MED ORDER — SODIUM CHLORIDE 0.9 % IV SOLN: Freq: Once | INTRAVENOUS | Status: DC

## 2020-07-13 MED ORDER — FAMOTIDINE IN NACL 20-0.9 MG/50ML-% IV SOLN: 20.0000 mg | Freq: Once | INTRAVENOUS | Status: DC | PRN

## 2020-07-13 MED ORDER — ALBUTEROL SULFATE HFA 108 (90 BASE) MCG/ACT IN AERS: 2.0000 | INHALATION_SPRAY | Freq: Once | RESPIRATORY_TRACT | Status: DC | PRN

## 2020-07-13 MED ORDER — SODIUM CHLORIDE 0.9 % IV SOLN: INTRAVENOUS | Status: DC | PRN

## 2020-07-13 NOTE — Discharge Instructions (Signed)

## 2020-07-13 NOTE — Progress Notes (Signed)
  Diagnosis: COVID-19 ° °Physician: Dr Wright  ° °Procedure: Cov[facility] Infusion Clinic Med: bamlanivimab\etesevimab infusion - Provided patient with bamlanimivab\etesevimab fact sheet for patients, parents and caregivers prior to infusion. ° °Complications: No immediate complications noted. ° °Discharge: Discharged home  ° °Paul Mayer L Paul Mayer °07/13/2020 ° ° °

## 2020-07-13 NOTE — Progress Notes (Signed)
I connected by phone with Paul Mayer on 07/13/2020 at 4:04 PM to discuss the potential use of a new treatment for mild to moderate COVID-19 viral infection in non-hospitalized patients.  This patient is a 44 y.o. male that meets the FDA criteria for Emergency Use Authorization of COVID monoclonal antibody casirivimab/imdevimab or bamlanivimab/eteseviamb.  Has a (+) direct SARS-CoV-2 viral test result  Has mild or moderate COVID-19   Is NOT hospitalized due to COVID-19  Is within 10 days of symptom onset  Has at least one of the high risk factor(s) for progression to severe COVID-19 and/or hospitalization as defined in EUA.  Specific high risk criteria : BMI > 25   I have spoken and communicated the following to the patient or parent/caregiver regarding COVID monoclonal antibody treatment:  1. FDA has authorized the emergency use for the treatment of mild to moderate COVID-19 in adults and pediatric patients with positive results of direct SARS-CoV-2 viral testing who are 72 years of age and older weighing at least 40 kg, and who are at high risk for progressing to severe COVID-19 and/or hospitalization.  2. The significant known and potential risks and benefits of COVID monoclonal antibody, and the extent to which such potential risks and benefits are unknown.  3. Information on available alternative treatments and the risks and benefits of those alternatives, including clinical trials.  4. Patients treated with COVID monoclonal antibody should continue to self-isolate and use infection control measures (e.g., wear mask, isolate, social distance, avoid sharing personal items, clean and disinfect "high touch" surfaces, and frequent handwashing) according to CDC guidelines.   5. The patient or parent/caregiver has the option to accept or refuse COVID monoclonal antibody treatment.  After reviewing this information with the patient, the patient has agreed to receive one of the  available covid 19 monoclonal antibodies and will be provided an appropriate fact sheet prior to infusion.   Jeanine Luz, FNP 07/13/2020 4:04 PM

## 2020-12-12 DIAGNOSIS — R5383 Other fatigue: Secondary | ICD-10-CM | POA: Diagnosis not present

## 2020-12-12 DIAGNOSIS — Z1159 Encounter for screening for other viral diseases: Secondary | ICD-10-CM | POA: Diagnosis not present

## 2020-12-12 DIAGNOSIS — R0602 Shortness of breath: Secondary | ICD-10-CM | POA: Diagnosis not present

## 2020-12-12 DIAGNOSIS — Z125 Encounter for screening for malignant neoplasm of prostate: Secondary | ICD-10-CM | POA: Diagnosis not present

## 2020-12-12 DIAGNOSIS — E559 Vitamin D deficiency, unspecified: Secondary | ICD-10-CM | POA: Diagnosis not present

## 2020-12-12 DIAGNOSIS — Z1339 Encounter for screening examination for other mental health and behavioral disorders: Secondary | ICD-10-CM | POA: Diagnosis not present

## 2020-12-12 DIAGNOSIS — Z0000 Encounter for general adult medical examination without abnormal findings: Secondary | ICD-10-CM | POA: Diagnosis not present

## 2020-12-19 DIAGNOSIS — L732 Hidradenitis suppurativa: Secondary | ICD-10-CM | POA: Diagnosis not present

## 2020-12-19 DIAGNOSIS — E789 Disorder of lipoprotein metabolism, unspecified: Secondary | ICD-10-CM | POA: Diagnosis not present

## 2020-12-19 DIAGNOSIS — R7303 Prediabetes: Secondary | ICD-10-CM | POA: Diagnosis not present

## 2020-12-19 DIAGNOSIS — E559 Vitamin D deficiency, unspecified: Secondary | ICD-10-CM | POA: Diagnosis not present

## 2020-12-26 DIAGNOSIS — K529 Noninfective gastroenteritis and colitis, unspecified: Secondary | ICD-10-CM | POA: Diagnosis not present

## 2021-05-10 ENCOUNTER — Other Ambulatory Visit (HOSPITAL_COMMUNITY): Payer: Self-pay

## 2021-06-03 ENCOUNTER — Other Ambulatory Visit (HOSPITAL_COMMUNITY): Payer: Self-pay

## 2021-06-03 MED ORDER — PREDNISONE 5 MG PO TABS
ORAL_TABLET | ORAL | 0 refills | Status: AC
  Filled 2021-06-03 – 2021-06-04 (×2): qty 21, 6d supply, fill #0

## 2021-06-04 ENCOUNTER — Other Ambulatory Visit (HOSPITAL_COMMUNITY): Payer: Self-pay

## 2022-05-09 ENCOUNTER — Other Ambulatory Visit (HOSPITAL_COMMUNITY): Payer: Self-pay

## 2022-05-09 ENCOUNTER — Encounter (HOSPITAL_BASED_OUTPATIENT_CLINIC_OR_DEPARTMENT_OTHER): Payer: Self-pay | Admitting: Obstetrics and Gynecology

## 2022-05-09 ENCOUNTER — Other Ambulatory Visit: Payer: Self-pay

## 2022-05-09 ENCOUNTER — Emergency Department (HOSPITAL_BASED_OUTPATIENT_CLINIC_OR_DEPARTMENT_OTHER)
Admission: EM | Admit: 2022-05-09 | Discharge: 2022-05-09 | Disposition: A | Discharge status: Home / Self Care | Payer: Self-pay | Attending: Emergency Medicine | Admitting: Emergency Medicine

## 2022-05-09 DIAGNOSIS — I10 Essential (primary) hypertension: Secondary | ICD-10-CM | POA: Insufficient documentation

## 2022-05-09 DIAGNOSIS — N390 Urinary tract infection, site not specified: Secondary | ICD-10-CM | POA: Insufficient documentation

## 2022-05-09 LAB — URINALYSIS, ROUTINE W REFLEX MICROSCOPIC
Bilirubin Urine: NEGATIVE
Glucose, UA: NEGATIVE mg/dL
Ketones, ur: NEGATIVE mg/dL
Nitrite: NEGATIVE
Specific Gravity, Urine: 1.015 (ref 1.005–1.030)
pH: 5.5 (ref 5.0–8.0)

## 2022-05-09 LAB — CBG MONITORING, ED: Glucose-Capillary: 102 mg/dL — ABNORMAL HIGH (ref 70–99)

## 2022-05-09 MED ORDER — CEPHALEXIN 500 MG PO CAPS
500.0000 mg | ORAL_CAPSULE | Freq: Three times a day (TID) | ORAL | 0 refills | Status: AC
  Filled 2022-05-09: qty 21, 7d supply, fill #0

## 2022-05-09 MED ORDER — CEPHALEXIN 500 MG PO CAPS
500.0000 mg | ORAL_CAPSULE | Freq: Three times a day (TID) | ORAL | 0 refills | Status: DC
  Filled 2022-05-09: qty 21, 7d supply, fill #0

## 2022-05-09 NOTE — ED Triage Notes (Signed)
Patient reports to the ER for concern about possible UTI. Patient reports he is close to pre-diabetic so he was concerned about his blood sugar as well. Denies concerns for STI, states he is in a monogamous relationship

## 2022-05-09 NOTE — ED Provider Notes (Signed)
[facility] EMERGENCY DEPT Provider Note   CSN: [id number] Arrival date & time: 05/09/22  1226     History  Chief Complaint  Patient presents with   Urinary Frequency    Paul Mayer is a 46 y.o. male.  DERREN Mayer is a 46 y.o. male who is otherwise healthy, presents to the emergency department for evaluation of dysuria.  For the past 2 weeks he reports that he has had some discomfort with urination and felt like he was not completely emptying his bladder.  In particular over the past few days he has had more persistent urinary frequency and felt like he needed to urinate every few minutes with some burning after urination.  He denies any flank pain, has had some mild suprapubic discomfort but otherwise no abdominal pain, no nausea or vomiting.  He has not noted any blood in his urine.  He denies any concern for STI.  Reports he has been in a monogamous marriage for many years.  Reports he has never had this issue before.  He denies any testicular pain or swelling.   The history is provided by the patient and medical records.  Urinary Frequency Pertinent negatives include no chest pain, no abdominal pain and no shortness of breath.       Home Medications Prior to Admission medications   Not on File      Allergies    Patient has no known allergies.    Review of Systems   Review of Systems  Constitutional:  Negative for chills and fever.  Respiratory:  Negative for cough and shortness of breath.   Cardiovascular:  Negative for chest pain.  Gastrointestinal:  Negative for abdominal pain, nausea and vomiting.  Genitourinary:  Positive for dysuria and frequency. Negative for flank pain, hematuria, penile swelling and scrotal swelling.  Musculoskeletal:  Negative for myalgias.  Skin:  Negative for color change and rash.    Physical Exam Updated Vital Signs BP (!) 150/96   Pulse 73   Temp 98.1 F (36.7 C)   Resp 15   Ht 6\' 1"  (1.854 m)   Wt 106.6  kg   SpO2 98%   BMI 31.00 kg/m  Physical Exam Vitals and nursing note reviewed.  Constitutional:      General: He is not in acute distress.    Appearance: Normal appearance. He is well-developed. He is not ill-appearing or diaphoretic.     Comments: Well-appearing and in no distress  HENT:     Head: Normocephalic and atraumatic.  Eyes:     General:        Right eye: No discharge.        Left eye: No discharge.  Cardiovascular:     Rate and Rhythm: Normal rate and regular rhythm.     Pulses: Normal pulses.     Heart sounds: Normal heart sounds.  Pulmonary:     Effort: Pulmonary effort is normal. No respiratory distress.     Breath sounds: Normal breath sounds. No wheezing or rales.     Comments: Respirations equal and unlabored, patient able to speak in full sentences, lungs clear to auscultation bilaterally  Abdominal:     General: Bowel sounds are normal. There is no distension.     Palpations: Abdomen is soft. There is no mass.     Tenderness: There is no abdominal tenderness. There is no right CVA tenderness, left CVA tenderness or guarding.     Comments: Abdomen soft, nondistended, nontender to palpation  in all quadrants without guarding or peritoneal signs, no CVA tenderness  Musculoskeletal:        General: No deformity.     Cervical back: Neck supple.  Skin:    General: Skin is warm and dry.     Capillary Refill: Capillary refill takes less than 2 seconds.  Neurological:     Mental Status: He is alert and oriented to person, place, and time.     Coordination: Coordination normal.     Comments: Speech is clear, able to follow commands Moves extremities without ataxia, coordination intact  Psychiatric:        Mood and Affect: Mood normal.        Behavior: Behavior normal.     ED Results / Procedures / Treatments   Labs (all labs ordered are listed, but only abnormal results are displayed) Labs Reviewed  URINALYSIS, ROUTINE W REFLEX MICROSCOPIC - Abnormal;  Notable for the following components:      Result Value   APPearance HAZY (*)    Hgb urine dipstick SMALL (*)    Protein, ur TRACE (*)    Leukocytes,Ua LARGE (*)    Bacteria, UA FEW (*)    All other components within normal limits  CBG MONITORING, ED - Abnormal; Notable for the following components:   Glucose-Capillary 102 (*)    All other components within normal limits    EKG None  Radiology No results found.  Procedures Procedures    Medications Ordered in ED Medications - No data to display  ED Course/ Medical Decision Making/ A&P                           Medical Decision Making Amount and/or Complexity of Data Reviewed Labs: ordered.   46 y.o. male presents to the ED with complaints of dysuria, urinary frequency, this involves an extensive number of treatment options, and is a complaint that carries with it a high risk of complications and morbidity.  The differential diagnosis includes UTI, pyelonephritis, STI, diabetes   On arrival pt is nontoxic, vitals significant for very mild hypertension otherwise normal. Exam significant for no abdominal or CVA tenderness  Additional history obtained from chart review. Previous records obtained and reviewed     Lab Tests:  I Ordered, reviewed, and interpreted labs, which included: Blood sugar minimally elevated at 102, UA shows hazy urine with large leukocytes, 21-50 WBCs and some bacteria present with white blood cell clumps.  Urine sent for culture.  IPatient declined STD testing, low suspicion as he has been in a long-term monogamous relationship.   ED Course:   We will send urine for culture and treat for UTI with Keflex.  We will have patient follow-up closely with PCP and with urology.  Patient with no signs of sepsis.  No flank tenderness to suggest pyelonephritis.  He is well-appearing.  Discussed with patient that UTIs are not typical in males and that he may need further evaluation especially if this becomes a  recurrent issue.  He expresses understanding and agreement with plan.  Discharged home in good condition.  Return precautions provided.    Portions of this note were generated with Scientist, clinical (histocompatibility and immunogenetics). Dictation errors may occur despite best attempts at proofreading.         Final Clinical Impression(s) / ED Diagnoses Final diagnoses:  Acute UTI    Rx / DC Orders ED Discharge Orders  Ordered    cephALEXin (KEFLEX) 500 MG capsule  3 times daily        05/09/22 1354              Dartha Lodge, [place] 05/09/22 1411    Derwood Kaplan, MD 05/10/22 1650

## 2022-05-09 NOTE — ED Notes (Signed)
Discharge instructions, follow up care, and prescriptions reviewed and explained, pt verbalized understanding and had no further questions on d/c. Pt caox4 and ambulatory on departure.

## 2022-05-09 NOTE — Discharge Instructions (Addendum)
Please take antibiotics 3 times daily for the next 7 days to treat UTI.  It is important that you take all of your antibiotics even if your symptoms resolve after a few days.  You have a urine culture pending and you will be called by phone if a different antibiotic would be more appropriate.  Please follow-up with your PCP and please follow-up with urology, as we discussed UTIs are uncommon in males and so this may require further evaluation.  If you develop fevers, vomiting, flank pain or other new or concerning symptoms return for reevaluation.

## 2022-05-11 LAB — URINE CULTURE: Culture: >=100000 — AB

## 2022-05-12 ENCOUNTER — Telehealth (HOSPITAL_BASED_OUTPATIENT_CLINIC_OR_DEPARTMENT_OTHER): Payer: Self-pay | Admitting: *Deleted

## 2022-05-12 NOTE — Telephone Encounter (Signed)
Post ED Visit - Positive Culture Follow-up  Culture report reviewed by antimicrobial stewardship pharmacist: [facility] []  [address] Pharm.D. []  [place], Pharm.D., BCPS AQ-ID []  , Pharm.D., BCPS []  Celedonio Miyamoto, .D., BCPS []  [place], .D., BCPS, AAHIVP []  Georgina Pillion, Pharm.D., BCPS, AAHIVP []  [address], PharmD, BCPS []  , PharmD, BCPS []  [place], PharmD, BCPS []  [address], PharmD []  , PharmD, BCPS [x]  Estella Husk, PharmD  Pharmacy Team []  Lysle Pearl, PharmD []  , PharmD []  Phillips Climes, PharmD []  , Rph []  Agapito Games) , PharmD []  Verlan Friends, PharmD []  , PharmD []  Mervyn Gay, PharmD []  , PharmD []  Agustin Cree, PharmD []  [facility], PharmD []  , PharmD []  Len Childs, PharmD   Positive urine culture Treated with Cephalexin, organism sensitive to the same and no further patient follow-up is required at this time.  05/12/2022, 9:29 AM

## 2023-06-02 DIAGNOSIS — R5383 Other fatigue: Secondary | ICD-10-CM | POA: Diagnosis not present

## 2023-06-02 DIAGNOSIS — R0602 Shortness of breath: Secondary | ICD-10-CM | POA: Diagnosis not present

## 2023-06-02 DIAGNOSIS — Z1339 Encounter for screening examination for other mental health and behavioral disorders: Secondary | ICD-10-CM | POA: Diagnosis not present

## 2023-06-02 DIAGNOSIS — Z0000 Encounter for general adult medical examination without abnormal findings: Secondary | ICD-10-CM | POA: Diagnosis not present

## 2023-06-02 DIAGNOSIS — E559 Vitamin D deficiency, unspecified: Secondary | ICD-10-CM | POA: Diagnosis not present

## 2023-06-02 DIAGNOSIS — F17210 Nicotine dependence, cigarettes, uncomplicated: Secondary | ICD-10-CM | POA: Diagnosis not present

## 2023-06-02 DIAGNOSIS — M1711 Unilateral primary osteoarthritis, right knee: Secondary | ICD-10-CM | POA: Diagnosis not present

## 2023-06-02 DIAGNOSIS — Z125 Encounter for screening for malignant neoplasm of prostate: Secondary | ICD-10-CM | POA: Diagnosis not present

## 2023-06-17 DIAGNOSIS — R0602 Shortness of breath: Secondary | ICD-10-CM | POA: Diagnosis not present

## 2023-06-17 DIAGNOSIS — E559 Vitamin D deficiency, unspecified: Secondary | ICD-10-CM | POA: Diagnosis not present

## 2023-06-17 DIAGNOSIS — E789 Disorder of lipoprotein metabolism, unspecified: Secondary | ICD-10-CM | POA: Diagnosis not present

## 2023-06-17 DIAGNOSIS — Z6833 Body mass index (BMI) 33.0-33.9, adult: Secondary | ICD-10-CM | POA: Diagnosis not present

## 2023-06-17 DIAGNOSIS — E6609 Other obesity due to excess calories: Secondary | ICD-10-CM | POA: Diagnosis not present

## 2023-06-17 DIAGNOSIS — R7303 Prediabetes: Secondary | ICD-10-CM | POA: Diagnosis not present

## 2023-06-18 DIAGNOSIS — E789 Disorder of lipoprotein metabolism, unspecified: Secondary | ICD-10-CM | POA: Diagnosis not present

## 2023-06-18 DIAGNOSIS — G4710 Hypersomnia, unspecified: Secondary | ICD-10-CM | POA: Diagnosis not present

## 2023-06-18 DIAGNOSIS — E6609 Other obesity due to excess calories: Secondary | ICD-10-CM | POA: Diagnosis not present

## 2023-06-18 DIAGNOSIS — Z6834 Body mass index (BMI) 34.0-34.9, adult: Secondary | ICD-10-CM | POA: Diagnosis not present

## 2023-06-18 DIAGNOSIS — R7303 Prediabetes: Secondary | ICD-10-CM | POA: Diagnosis not present

## 2023-07-15 DIAGNOSIS — Z1211 Encounter for screening for malignant neoplasm of colon: Secondary | ICD-10-CM | POA: Diagnosis not present

## 2023-07-15 LAB — HM COLONOSCOPY

## 2023-07-20 DIAGNOSIS — Z1211 Encounter for screening for malignant neoplasm of colon: Secondary | ICD-10-CM | POA: Diagnosis not present

## 2024-02-02 ENCOUNTER — Ambulatory Visit: Admitting: Sleep Medicine

## 2024-02-02 ENCOUNTER — Encounter: Payer: Self-pay | Admitting: Sleep Medicine

## 2024-02-02 VITALS — BP 138/90 | HR 83 | Temp 97.7°F | Ht 73.0 in | Wt 248.8 lb

## 2024-02-02 DIAGNOSIS — Z6832 Body mass index (BMI) 32.0-32.9, adult: Secondary | ICD-10-CM

## 2024-02-02 DIAGNOSIS — E669 Obesity, unspecified: Secondary | ICD-10-CM | POA: Diagnosis not present

## 2024-02-02 DIAGNOSIS — G4733 Obstructive sleep apnea (adult) (pediatric): Secondary | ICD-10-CM | POA: Diagnosis not present

## 2024-02-02 DIAGNOSIS — F17290 Nicotine dependence, other tobacco product, uncomplicated: Secondary | ICD-10-CM | POA: Diagnosis not present

## 2024-02-02 NOTE — Patient Instructions (Signed)
 Paul Mayer

## 2024-02-02 NOTE — Progress Notes (Signed)
 Name:Paul Mayer MRN: [id number] DOB: 03-03-1976   CHIEF COMPLAINT:  EXCESSIVE DAYTIME SLEEPINESS   HISTORY OF PRESENT ILLNESS:  Paul Mayer is a 48 y.o. w/ a h/o obesity who presents for c/o loud snoring, witnessed apnea and excessive daytime sleepiness which has been present for several years. Reports nocturnal awakenings due to unclear reasons, however does not have difficulty falling back to sleep. Reports a 15 lb weight gain. Admits to dry mouth and headaches. Denies RLS symptoms, dream enactment, cataplexy, hypnagogic or hypnapompic hallucinations. Denies a family history of sleep apnea. Denies drowsy driving. Drinks 1 cup of coffee daily, occasional alcohol  use, smokes cigars once per week, denies illicit drug use.   Bedtime 11 pm-12 am Sleep onset 30 mins Rise time 6:45-7 am   EPWORTH SLEEP SCORE 9    02/02/2024    3:14 PM  Results of the Epworth flowsheet  Sitting and reading 3  Watching TV 2  Sitting, inactive in a public place (e.g. a theatre or a meeting) 0  As a passenger in a car for an hour without a break 2  Lying down to rest in the afternoon when circumstances permit 2  Sitting and talking to someone 0  Sitting quietly after a lunch without alcohol  0  In a car, while stopped for a few minutes in traffic 0  Total score 9     PAST MEDICAL HISTORY :   has a past medical history of Allergy.  has no past surgical history on file. Prior to Admission medications   Not on File   No Known Allergies  FAMILY HISTORY:  family history includes Hyperlipidemia in his father. SOCIAL HISTORY:  reports that he has been smoking cigars and cigarettes. He has never used smokeless tobacco. He reports current alcohol  use. He reports that he does not use drugs.   Review of Systems:  Gen:  Denies  fever, sweats, chills weight loss  HEENT: Denies blurred vision, double vision, ear pain, eye pain, hearing loss, nose bleeds, sore throat Cardiac:  No dizziness,  chest pain or heaviness, chest tightness,edema, No JVD Resp:   No cough, -sputum production, -shortness of breath,-wheezing, -hemoptysis,  Gi: Denies swallowing difficulty, stomach pain, nausea or vomiting, diarrhea, constipation, bowel incontinence Gu:  Denies bladder incontinence, burning urine Ext:   Denies Joint pain, stiffness or swelling Skin: Denies  skin rash, easy bruising or bleeding or hives Endoc:  Denies polyuria, polydipsia , polyphagia or weight change Psych:   Denies depression, insomnia or hallucinations  Other:  All other systems negative  VITAL SIGNS: BP (!) 138/90 (BP Location: Left Arm, Patient Position: Sitting, Cuff Size: Normal)   Pulse 83   Temp 97.7 F (36.5 C) (Temporal)   Ht 6\' 1"  (1.854 m)   Wt 248 lb 12.8 oz (112.9 kg)   SpO2 98%   BMI 32.83 kg/m    Physical Examination:   General Appearance: No distress  EYES PERRLA, EOM intact.   NECK Supple, No JVD Pulmonary: normal breath sounds, No wheezing.  CardiovascularNormal S1,S2.  No m/r/g.   Abdomen: Benign, Soft, non-tender. Skin:   warm, no rashes, no ecchymosis  Extremities: normal, no cyanosis, clubbing. Neuro:without focal findings,  speech normal  PSYCHIATRIC: Mood, affect within normal limits.   ASSESSMENT AND PLAN  OSA I suspect that OSA is likely present due to clinical presentation. Discussed the consequences of untreated sleep apnea. Advised not to drive drowsy for safety of patient and others. Will complete  further evaluation with a home sleep study and follow up to review results.    Obesity Counseled patient on diet and lifestyle modification.    MEDICATION ADJUSTMENTS/LABS AND TESTS ORDERED: Recommend Sleep Study   Patient  satisfied with Plan of action and management. All questions answered  Follow up to review HST results and treatment plan.   I spent a total of 30 minutes reviewing chart data, face-to-face evaluation with the patient, counseling and coordination of care  as detailed above.    Lendell Gallick, M.D.  Sleep Medicine [facility]

## 2024-02-08 ENCOUNTER — Encounter

## 2024-02-08 DIAGNOSIS — G4733 Obstructive sleep apnea (adult) (pediatric): Secondary | ICD-10-CM

## 2024-02-25 DIAGNOSIS — G4733 Obstructive sleep apnea (adult) (pediatric): Secondary | ICD-10-CM | POA: Diagnosis not present

## 2024-03-07 ENCOUNTER — Telehealth: Admitting: Sleep Medicine

## 2024-03-07 ENCOUNTER — Ambulatory Visit: Admitting: Sleep Medicine

## 2024-03-07 DIAGNOSIS — E669 Obesity, unspecified: Secondary | ICD-10-CM

## 2024-03-07 DIAGNOSIS — G4733 Obstructive sleep apnea (adult) (pediatric): Secondary | ICD-10-CM

## 2024-03-07 NOTE — Patient Instructions (Signed)

## 2024-03-07 NOTE — Progress Notes (Signed)
       Name:Paul Mayer MRN: [id number] DOB: [birth year]   CHIEF COMPLAINT:  HST F/U   HISTORY OF PRESENT ILLNESS:  Mr. Mase is a 48 y.o. w/ a h/o obesity who presents virtually to follow up on HST results. The patient underwent HST which revealed severe OSA (AHI 52, O2 nadir 71%).     EPWORTH SLEEP SCORE     02/02/2024    3:14 PM  Results of the Epworth flowsheet  Sitting and reading 3  Watching TV 2  Sitting, inactive in a public place (e.g. a theatre or a meeting) 0  As a passenger in a car for an hour without a break 2  Lying down to rest in the afternoon when circumstances permit 2  Sitting and talking to someone 0  Sitting quietly after a lunch without alcohol  0  In a car, while stopped for a few minutes in traffic 0  Total score 9     PAST MEDICAL HISTORY :   has a past medical history of Allergy.  has no past surgical history on file. Prior to Admission medications   Not on File   No Known Allergies  FAMILY HISTORY:  family history includes Hyperlipidemia in his father. SOCIAL HISTORY:  reports that he has been smoking cigars and cigarettes. He has never used smokeless tobacco. He reports current alcohol  use. He reports that he does not use drugs.   Review of Systems:  Gen:  Denies  fever, sweats, chills weight loss  HEENT: Denies blurred vision, double vision, ear pain, eye pain, hearing loss, nose bleeds, sore throat Cardiac:  No dizziness, chest pain or heaviness, chest tightness,edema, No JVD Resp:   No cough, -sputum production, -shortness of breath,-wheezing, -hemoptysis,  Gi: Denies swallowing difficulty, stomach pain, nausea or vomiting, diarrhea, constipation, bowel incontinence Gu:  Denies bladder incontinence, burning urine Ext:   Denies Joint pain, stiffness or swelling Skin: Denies  skin rash, easy bruising or bleeding or hives Endoc:  Denies polyuria, polydipsia , polyphagia or weight change Psych:   Denies depression, insomnia or  hallucinations  Other:  All other systems negative  VITAL SIGNS: Unattainable due to virtual visit   Physical Examination:   General Appearance: No distress  EYES PERRLA, EOM intact.   NECK Supple, No JVD Pulmonary: normal breath sounds, No wheezing.  CardiovascularNormal S1,S2.  No m/r/g.   Abdomen: Benign, Soft, non-tender. Skin:   warm, no rashes, no ecchymosis  Extremities: normal, no cyanosis, clubbing. Neuro:without focal findings,  speech normal  PSYCHIATRIC: Mood, affect within normal limits.   ASSESSMENT AND PLAN  OSA Reviewed HST results with patient. Starting on APAP therapy set to 4-16 cm H2O. Discussed the consequences of untreated sleep apnea. Advised not to drive drowsy for safety of patient and others. Will follow up in 3 months.   Obesity Counseled patient on diet and lifestyle modification.    Patient  satisfied with Plan of action and management. All questions answered  I spent a total of 23 minutes reviewing chart data, face-to-face evaluation with the patient, counseling and coordination of care as detailed above.    Ramona Ruark, M.D.  Sleep Medicine [facility]

## 2024-03-14 ENCOUNTER — Encounter: Payer: Self-pay | Admitting: Nurse Practitioner

## 2024-03-14 ENCOUNTER — Ambulatory Visit: Admitting: Nurse Practitioner

## 2024-03-14 VITALS — BP 138/86 | HR 81 | Ht 73.0 in | Wt 250.6 lb

## 2024-03-14 DIAGNOSIS — G4733 Obstructive sleep apnea (adult) (pediatric): Secondary | ICD-10-CM

## 2024-03-14 DIAGNOSIS — E785 Hyperlipidemia, unspecified: Secondary | ICD-10-CM | POA: Diagnosis not present

## 2024-03-14 DIAGNOSIS — E669 Obesity, unspecified: Secondary | ICD-10-CM | POA: Diagnosis not present

## 2024-03-14 DIAGNOSIS — E559 Vitamin D deficiency, unspecified: Secondary | ICD-10-CM | POA: Insufficient documentation

## 2024-03-14 DIAGNOSIS — R030 Elevated blood-pressure reading, without diagnosis of hypertension: Secondary | ICD-10-CM

## 2024-03-14 DIAGNOSIS — R7303 Prediabetes: Secondary | ICD-10-CM | POA: Insufficient documentation

## 2024-03-14 LAB — HM HEPATITIS C SCREENING LAB: HM Hepatitis Screen: NEGATIVE

## 2024-03-14 LAB — HM HIV SCREENING LAB: HM HIV Screening: NEGATIVE

## 2024-03-14 NOTE — Assessment & Plan Note (Signed)
 His blood pressure is 138/86 mmHg, and no medication is needed currently. Regular monitoring is advised due to family history. Improvement is anticipated with lifestyle changes and CPAP. Monitor blood pressure regularly and encourage lifestyle modifications, including diet and exercise.

## 2024-03-14 NOTE — Assessment & Plan Note (Signed)
 Check vitamin D levels and treat based on results. He is currently taking a multivitamin.

## 2024-03-14 NOTE — Assessment & Plan Note (Addendum)
 Chronic, ongoing. Check A1c today and treat based on results. Continue focus on exercise and nutrition.

## 2024-03-14 NOTE — Patient Instructions (Signed)
It was great to see you!  We are checking your labs today and will let you know the results via mychart/phone.   Let's follow-up in 1 year, sooner if you have concerns.  If a referral was placed today, you will be contacted for an appointment. Please note that routine referrals can sometimes take up to 3-4 weeks to process. Please call our office if you haven't heard anything after this time frame.  Take care,  Naina Sleeper, NP  

## 2024-03-14 NOTE — Assessment & Plan Note (Signed)
 He is awaiting a CPAP machine. Initiate CPAP therapy once the machine is available. Continue following with sleep specialist.

## 2024-03-14 NOTE — Assessment & Plan Note (Addendum)
 Chronic, ongoing. Cholesterol levels are elevated, and he is self-managing with [place]. There is a family history of hyperlipidemia. Check CMP, CBC, lipid panel and treat based on results.

## 2024-03-14 NOTE — Progress Notes (Signed)
 New Patient Visit  BP 138/86 (BP Location: Left Arm, Patient Position: Sitting, Cuff Size: Normal)   Pulse 81   Ht 6\' 1"  (1.854 m)   Wt 250 lb 9.6 oz (113.7 kg)   SpO2 96%   BMI 33.06 kg/m    Subjective:    Patient ID: Paul Mayer, male    DOB: 09/02/1976, 48 y.o.   MRN: [id number]  CC: Chief Complaint  Patient presents with   Establish Care    NP. Est. Care, concerns with weight, request fasting labs    HPI: Paul Mayer is a 48 y.o. male presents for new patient visit to establish care.  Introduced to Publishing rights manager role and practice setting.  All questions answered.  Discussed provider/patient relationship and expectations.  Discussed the use of AI scribe software for clinical note transcription with the patient, who gave verbal consent to proceed.  History of Present Illness   Paul Mayer is a 48 year old male who presents to establish care and would like to discuss his weight and overall health.  He was diagnosed with sleep apnea one to two weeks ago and is awaiting a CPAP machine. He experiences fatigue and tiredness. No chest pain, shortness of breath at rest, dizziness, or headaches.  He is concerned about his weight, which is at its highest. He is managing his weight through exercise, including running and gym workouts, and dietary changes. He eats two meals a day, focusing on salads, chicken, and fish, and avoids red meat. He sometimes eats late at night, which he believes contributes to his weight issues. He recently experienced nocturnal acid reflux, relieved by sitting up and drinking water.  He has high cholesterol and takes Coquil and a multivitamin.     Depression and Anxiety Screen Done:     03/14/2024    1:21 PM 05/27/2017    5:37 PM 03/08/2015    1:21 PM  Depression screen PHQ 2/9  Decreased Interest 0 0 0  Down, Depressed, Hopeless 0 0 0  PHQ - 2 Score 0 0 0  Altered sleeping 0    Tired, decreased energy 1    Change in appetite 0     Feeling bad or failure about yourself  0    Trouble concentrating 0    Moving slowly or fidgety/restless 0    Suicidal thoughts 0    PHQ-9 Score 1    Difficult doing work/chores Not difficult at all        03/14/2024    1:22 PM  GAD 7 : Generalized Anxiety Score  Nervous, Anxious, on Edge 1  Control/stop worrying 0  Worry too much - different things 1  Trouble relaxing 1  Restless 1  Easily annoyed or irritable 1  Afraid - awful might happen 0  Total GAD 7 Score 5  Anxiety Difficulty Not difficult at all    Past Medical History:  Diagnosis Date   Allergy    Hyperlipidemia    Sleep apnea    Vitamin D deficiency     History reviewed. No pertinent surgical history.  Family History  Problem Relation Age of Onset   Hypertension Father    Hyperlipidemia Father      Social History   Tobacco Use   Smoking status: Some Days    Types: Cigars   Smokeless tobacco: Never   Tobacco comments:    2 days per week  Vaping Use   Vaping status: Never Used  Substance Use Topics  Alcohol  use: Yes    Alcohol /week: 3.0 standard drinks of alcohol     Types: 3 Standard drinks or equivalent per week   Drug use: No    Current Outpatient Medications on File Prior to Visit  Medication Sig Dispense Refill   co-enzyme Q-10 30 MG capsule Take 30 mg by mouth daily. OTC     Multiple Vitamin (MULTIVITAMIN) tablet Take 1 tablet by mouth daily.     No current facility-administered medications on file prior to visit.     Review of Systems  Constitutional:  Positive for fatigue. Negative for fever.  HENT: Negative.    Eyes: Negative.   Respiratory: Negative.    Cardiovascular: Negative.   Gastrointestinal: Negative.   Endocrine: Negative.   Genitourinary: Negative.   Musculoskeletal: Negative.   Skin: Negative.   Neurological: Negative.   Psychiatric/Behavioral: Negative.         Objective:     BP 138/86 (BP Location: Left Arm, Patient Position: Sitting, Cuff Size: Normal)    Pulse 81   Ht 6\' 1"  (1.854 m)   Wt 250 lb 9.6 oz (113.7 kg)   SpO2 96%   BMI 33.06 kg/m   Wt Readings from Last 3 Encounters:  03/14/24 250 lb 9.6 oz (113.7 kg)  02/02/24 248 lb 12.8 oz (112.9 kg)  05/09/22 235 lb (106.6 kg)    BP Readings from Last 3 Encounters:  03/14/24 138/86  02/02/24 (!) 138/90  05/09/22 (!) 134/98    Physical Exam Vitals and nursing note reviewed.  Constitutional:      General: He is not in acute distress.    Appearance: Normal appearance.  HENT:     Head: Normocephalic and atraumatic.     Right Ear: Tympanic membrane, ear canal and external ear normal.     Left Ear: Tympanic membrane, ear canal and external ear normal.  Eyes:     Conjunctiva/sclera: Conjunctivae normal.  Cardiovascular:     Rate and Rhythm: Normal rate and regular rhythm.     Pulses: Normal pulses.     Heart sounds: Normal heart sounds.  Pulmonary:     Effort: Pulmonary effort is normal.     Breath sounds: Normal breath sounds.  Abdominal:     Palpations: Abdomen is soft.     Tenderness: There is no abdominal tenderness.  Musculoskeletal:        General: Normal range of motion.     Cervical back: Normal range of motion and neck supple. No tenderness.     Right lower leg: No edema.     Left lower leg: No edema.  Lymphadenopathy:     Cervical: No cervical adenopathy.  Skin:    General: Skin is warm and dry.  Neurological:     General: No focal deficit present.     Mental Status: He is alert and oriented to person, place, and time.     Cranial Nerves: No cranial nerve deficit.     Coordination: Coordination normal.     Gait: Gait normal.  Psychiatric:        Mood and Affect: Mood normal.        Behavior: Behavior normal.        Thought Content: Thought content normal.        Judgment: Judgment normal.        Assessment & Plan:   Problem List Items Addressed This Visit       Respiratory   OSA (obstructive sleep apnea) - Primary   He is  awaiting a CPAP machine.  Initiate CPAP therapy once the machine is available. Continue following with sleep specialist.        Other   Hyperlipidemia   Chronic, ongoing. Cholesterol levels are elevated, and he is self-managing with Coquil. There is a family history of hyperlipidemia. Check CMP, CBC, lipid panel and treat based on results.       Relevant Orders   CBC with Differential/Platelet   Comprehensive metabolic panel with GFR   Lipid panel   Prediabetes   Chronic, ongoing. Check A1c today and treat based on results. Continue focus on exercise and nutrition.       Relevant Orders   Comprehensive metabolic panel with GFR   Hemoglobin A1c   Obesity (BMI 30-39.9)   BMI 33. Weight gain is a concern despite engaging in exercise and healthy eating. He faces challenges with late-night eating. Dietary modifications, portion control, and intermittent fasting were discussed. Encourage continued exercise and healthy eating, consider tracking food intake and portion sizes, and discuss intermittent fasting as a potential strategy. Check TSH today.       Relevant Orders   TSH   Vitamin D deficiency   Check vitamin D levels and treat based on results. He is currently taking a multivitamin.       Relevant Orders   VITAMIN D 25 Hydroxy (Vit-D Deficiency, Fractures)   Elevated blood pressure reading   His blood pressure is 138/86 mmHg, and no medication is needed currently. Regular monitoring is advised due to family history. Improvement is anticipated with lifestyle changes and CPAP. Monitor blood pressure regularly and encourage lifestyle modifications, including diet and exercise.       Follow up plan: Return in about 1 year (around 03/14/2025) for CPE.  Kagan Mutchler A Maecyn Panning

## 2024-03-14 NOTE — Assessment & Plan Note (Signed)
 BMI 33. Weight gain is a concern despite engaging in exercise and healthy eating. He faces challenges with late-night eating. Dietary modifications, portion control, and intermittent fasting were discussed. Encourage continued exercise and healthy eating, consider tracking food intake and portion sizes, and discuss intermittent fasting as a potential strategy. Check TSH today.

## 2024-03-15 ENCOUNTER — Ambulatory Visit: Payer: Self-pay | Admitting: Nurse Practitioner

## 2024-03-15 ENCOUNTER — Ambulatory Visit: Payer: Self-pay | Admitting: Sleep Medicine

## 2024-03-15 LAB — COMPREHENSIVE METABOLIC PANEL WITH GFR
AG Ratio: 1.6 (calc) (ref 1.0–2.5)
ALT: 38 U/L (ref 9–46)
AST: 32 U/L (ref 10–40)
Albumin: 4.6 g/dL (ref 3.6–5.1)
Alkaline phosphatase (APISO): 74 U/L (ref 36–130)
BUN: 13 mg/dL (ref 7–25)
CO2: 25 mmol/L (ref 20–32)
Calcium: 9.4 mg/dL (ref 8.6–10.3)
Chloride: 105 mmol/L (ref 98–110)
Creat: 1 mg/dL (ref 0.60–1.29)
Globulin: 2.8 g/dL (ref 1.9–3.7)
Glucose, Bld: 103 mg/dL — ABNORMAL HIGH (ref 65–99)
Potassium: 4.4 mmol/L (ref 3.5–5.3)
Sodium: 139 mmol/L (ref 135–146)
Total Bilirubin: 0.4 mg/dL (ref 0.2–1.2)
Total Protein: 7.4 g/dL (ref 6.1–8.1)
eGFR: 94 mL/min/{1.73_m2} (ref >=60)

## 2024-03-15 LAB — CBC WITH DIFFERENTIAL/PLATELET
Absolute Lymphocytes: 3454 {cells}/uL (ref 850–3900)
Absolute Monocytes: 828 {cells}/uL (ref 200–950)
Basophils Absolute: 51 {cells}/uL (ref 0–200)
Basophils Relative: 0.5 %
Eosinophils Absolute: 101 {cells}/uL (ref 15–500)
Eosinophils Relative: 1 %
HCT: 47 % (ref 38.5–50.0)
Hemoglobin: 16 g/dL (ref 13.2–17.1)
MCH: 31.1 pg (ref 27.0–33.0)
MCHC: 34 g/dL (ref 32.0–36.0)
MCV: 91.4 fL (ref 80.0–100.0)
MPV: 10.8 fL (ref 7.5–12.5)
Monocytes Relative: 8.2 %
Neutro Abs: 5666 {cells}/uL (ref 1500–7800)
Neutrophils Relative %: 56.1 %
Platelets: 257 10*3/uL (ref 140–400)
RBC: 5.14 10*6/uL (ref 4.20–5.80)
RDW: 12.7 % (ref 11.0–15.0)
Total Lymphocyte: 34.2 %
WBC: 10.1 10*3/uL (ref 3.8–10.8)

## 2024-03-15 LAB — HEMOGLOBIN A1C
Hgb A1c MFr Bld: 5.9 % — ABNORMAL HIGH (ref <5.7)
Mean Plasma Glucose: 123 mg/dL
eAG (mmol/L): 6.8 mmol/L

## 2024-03-15 LAB — VITAMIN D 25 HYDROXY (VIT D DEFICIENCY, FRACTURES): Vit D, 25-Hydroxy: 47 ng/mL (ref 30–100)

## 2024-03-15 LAB — LIPID PANEL
Cholesterol: 259 mg/dL — ABNORMAL HIGH (ref <200)
HDL: 55 mg/dL (ref >=40)
LDL Cholesterol (Calc): 164 mg/dL — ABNORMAL HIGH
Non-HDL Cholesterol (Calc): 204 mg/dL — ABNORMAL HIGH (ref <130)
Total CHOL/HDL Ratio: 4.7 (calc) (ref <5.0)
Triglycerides: 236 mg/dL — ABNORMAL HIGH (ref <150)

## 2024-03-15 LAB — TSH: TSH: 1.76 m[IU]/L (ref 0.40–4.50)

## 2024-03-22 DIAGNOSIS — R400 Somnolence: Secondary | ICD-10-CM | POA: Diagnosis not present

## 2024-03-22 DIAGNOSIS — G4733 Obstructive sleep apnea (adult) (pediatric): Secondary | ICD-10-CM | POA: Diagnosis not present

## 2024-04-21 DIAGNOSIS — G4733 Obstructive sleep apnea (adult) (pediatric): Secondary | ICD-10-CM | POA: Diagnosis not present

## 2024-04-21 DIAGNOSIS — R400 Somnolence: Secondary | ICD-10-CM | POA: Diagnosis not present

## 2024-04-26 ENCOUNTER — Other Ambulatory Visit (HOSPITAL_COMMUNITY): Payer: Self-pay

## 2024-04-26 MED ORDER — AMOXICILLIN 500 MG PO CAPS
500.0000 mg | ORAL_CAPSULE | Freq: Three times a day (TID) | ORAL | 0 refills | Status: AC
  Filled 2024-04-26: qty 21, 7d supply, fill #0

## 2024-05-04 ENCOUNTER — Encounter: Payer: Self-pay | Admitting: Sleep Medicine

## 2024-05-04 ENCOUNTER — Ambulatory Visit: Admitting: Sleep Medicine

## 2024-05-04 VITALS — BP 138/82 | HR 81 | Temp 97.1°F | Ht 73.0 in | Wt 252.8 lb

## 2024-05-04 DIAGNOSIS — E669 Obesity, unspecified: Secondary | ICD-10-CM

## 2024-05-04 DIAGNOSIS — Z6833 Body mass index (BMI) 33.0-33.9, adult: Secondary | ICD-10-CM

## 2024-05-04 DIAGNOSIS — G4733 Obstructive sleep apnea (adult) (pediatric): Secondary | ICD-10-CM | POA: Diagnosis not present

## 2024-05-04 NOTE — Progress Notes (Unsigned)
 Name:Paul Mayer MRN: [id number] DOB: [birth year]   CHIEF COMPLAINT:  CPAP F/U   HISTORY OF PRESENT ILLNESS:  Paul Mayer is a 48 y.o. w/ a h/o who present for c/o loud snoring and excessive daytime sleepiness which has been present for several years. Reports nocturnal awakenings due to unclear reasons, however does not have difficulty falling back to sleep. Denies any significant weight changes. Denies morning headaches, RLS symptoms, dream enactment, cataplexy, hypnagogic or hypnapompic hallucinations. Reports a family history of sleep apnea. Denies drowsy driving. Drinks 1-2 sodas daily, occasional alcohol  use, former smoker, denies illicit drug use.   Bedtime 11 pm Sleep onset 10 mins Rise time 6:30-7:30 am   EPWORTH SLEEP SCORE    02/02/2024    3:14 PM  Results of the Epworth flowsheet  Sitting and reading 3  Watching TV 2  Sitting, inactive in a public place (e.g. a theatre or a meeting) 0  As a passenger in a car for an hour without a break 2  Lying down to rest in the afternoon when circumstances permit 2  Sitting and talking to someone 0  Sitting quietly after a lunch without alcohol  0  In a car, while stopped for a few minutes in traffic 0  Total score 9     PAST MEDICAL HISTORY :   has a past medical history of Allergy, Hyperlipidemia, Sleep apnea, and Vitamin D  deficiency.  has no past surgical history on file. Prior to Admission medications   Medication Sig Start Date End Date Taking? Authorizing Provider  co-enzyme Q-10 30 MG capsule Take 30 mg by mouth daily. OTC   Yes [provider]  Multiple Vitamin (MULTIVITAMIN) tablet Take 1 tablet by mouth daily.   Yes [provider]   No Known Allergies  FAMILY HISTORY:  family history includes Hyperlipidemia in his father; Hypertension in his father. SOCIAL HISTORY:  reports that he has been smoking cigars. He has never used smokeless tobacco. He reports current alcohol  use of about  3.0 standard drinks of alcohol  per week. He reports that he does not use drugs.   Review of Systems:  Gen:  Denies  fever, sweats, chills weight loss  HEENT: Denies blurred vision, double vision, ear pain, eye pain, hearing loss, nose bleeds, sore throat Cardiac:  No dizziness, chest pain or heaviness, chest tightness,edema, No JVD Resp:   No cough, -sputum production, -shortness of breath,-wheezing, -hemoptysis,  Gi: Denies swallowing difficulty, stomach pain, nausea or vomiting, diarrhea, constipation, bowel incontinence Gu:  Denies bladder incontinence, burning urine Ext:   Denies Joint pain, stiffness or swelling Skin: Denies  skin rash, easy bruising or bleeding or hives Endoc:  Denies polyuria, polydipsia , polyphagia or weight change Psych:   Denies depression, insomnia or hallucinations  Other:  All other systems negative  VITAL SIGNS: BP 138/82 (BP Location: Right Arm, Cuff Size: Large)   Pulse 81   Temp (!) 97.1 F (36.2 C)   Ht 6' 1 (1.854 m)   Wt 252 lb 12.8 oz (114.7 kg)   SpO2 100%   BMI 33.35 kg/m    Physical Examination:   General Appearance: No distress  EYES PERRLA, EOM intact.   NECK Supple, No JVD Pulmonary: normal breath sounds, No wheezing.  CardiovascularNormal S1,S2.  No m/r/g.   Abdomen: Benign, Soft, non-tender. Skin:   warm, no rashes, no ecchymosis  Extremities: normal, no cyanosis, clubbing. Neuro:without focal findings,  speech normal  PSYCHIATRIC: Mood, affect within  normal limits.   ASSESSMENT AND PLAN  OSA Patient is using and benefiting from CPAP therapy. Discussed the consequences of untreated sleep apnea. Advised not to drive drowsy for safety of patient and others. Will follow up in 3 months.     Obesity Counseled patient on diet and lifestyle modification.    Patient  satisfied with Plan of action and management. All questions answered  I spent a total of 25 minutes reviewing chart data, face-to-face evaluation with the  patient, counseling and coordination of care as detailed above.    Jaeveon Ashland, M.D.  Sleep Medicine [facility]

## 2024-05-04 NOTE — Patient Instructions (Signed)

## 2024-05-05 ENCOUNTER — Other Ambulatory Visit (HOSPITAL_COMMUNITY): Payer: Self-pay

## 2024-05-05 MED ORDER — PENICILLIN V POTASSIUM 500 MG PO TABS
500.0000 mg | ORAL_TABLET | Freq: Four times a day (QID) | ORAL | 0 refills | Status: AC
  Filled 2024-05-05: qty 30, 8d supply, fill #0

## 2024-05-05 MED ORDER — IBUPROFEN 600 MG PO TABS
600.0000 mg | ORAL_TABLET | Freq: Four times a day (QID) | ORAL | 0 refills | Status: AC | PRN
  Filled 2024-05-05: qty 30, 8d supply, fill #0

## 2024-05-05 MED ORDER — CHLORHEXIDINE GLUCONATE 0.12 % MT SOLN
OROMUCOSAL | 0 refills | Status: AC
  Filled 2024-05-05: qty 473, 16d supply, fill #0

## 2024-05-22 DIAGNOSIS — G4733 Obstructive sleep apnea (adult) (pediatric): Secondary | ICD-10-CM | POA: Diagnosis not present

## 2024-05-22 DIAGNOSIS — R400 Somnolence: Secondary | ICD-10-CM | POA: Diagnosis not present

## 2024-06-22 DIAGNOSIS — R400 Somnolence: Secondary | ICD-10-CM | POA: Diagnosis not present

## 2024-06-22 DIAGNOSIS — G4733 Obstructive sleep apnea (adult) (pediatric): Secondary | ICD-10-CM | POA: Diagnosis not present

## 2024-07-08 ENCOUNTER — Other Ambulatory Visit: Payer: Self-pay

## 2024-07-08 ENCOUNTER — Encounter: Payer: Self-pay | Admitting: Pharmacist

## 2024-07-22 DIAGNOSIS — R400 Somnolence: Secondary | ICD-10-CM | POA: Diagnosis not present

## 2024-08-09 ENCOUNTER — Ambulatory Visit: Admitting: Sleep Medicine

## 2024-08-16 ENCOUNTER — Ambulatory Visit: Admitting: Sleep Medicine

## 2024-08-18 ENCOUNTER — Encounter: Payer: Self-pay | Admitting: Sleep Medicine

## 2024-08-22 DIAGNOSIS — R400 Somnolence: Secondary | ICD-10-CM | POA: Diagnosis not present

## 2024-08-22 DIAGNOSIS — G4733 Obstructive sleep apnea (adult) (pediatric): Secondary | ICD-10-CM | POA: Diagnosis not present

## 2024-09-20 ENCOUNTER — Other Ambulatory Visit (HOSPITAL_COMMUNITY): Payer: Self-pay

## 2024-09-21 DIAGNOSIS — G4733 Obstructive sleep apnea (adult) (pediatric): Secondary | ICD-10-CM | POA: Diagnosis not present

## 2024-09-21 DIAGNOSIS — R400 Somnolence: Secondary | ICD-10-CM | POA: Diagnosis not present

## 2024-09-22 ENCOUNTER — Other Ambulatory Visit (HOSPITAL_COMMUNITY): Payer: Self-pay
